# Patient Record
Sex: Female | Born: 1965 | Race: Black or African American | Hispanic: No | Marital: Married | Smoking: Never smoker
Health system: Southern US, Community
[De-identification: ages and names within clinical notes are randomized; demographics above are authoritative.]

---

## 2018-10-17 ENCOUNTER — Emergency Department (HOSPITAL_COMMUNITY): Payer: Self-pay

## 2018-10-17 ENCOUNTER — Encounter (HOSPITAL_COMMUNITY): Payer: Self-pay

## 2018-10-17 ENCOUNTER — Emergency Department (HOSPITAL_COMMUNITY)
Admission: EM | Admit: 2018-10-17 | Discharge: 2018-10-17 | Disposition: A | Discharge status: Home / Self Care | Payer: Self-pay | Attending: Emergency Medicine | Admitting: Emergency Medicine

## 2018-10-17 ENCOUNTER — Other Ambulatory Visit: Payer: Self-pay

## 2018-10-17 DIAGNOSIS — R1013 Epigastric pain: Secondary | ICD-10-CM | POA: Insufficient documentation

## 2018-10-17 DIAGNOSIS — R1084 Generalized abdominal pain: Secondary | ICD-10-CM

## 2018-10-17 DIAGNOSIS — Z7982 Long term (current) use of aspirin: Secondary | ICD-10-CM | POA: Insufficient documentation

## 2018-10-17 LAB — COMPREHENSIVE METABOLIC PANEL
ALT: 30 U/L (ref 0–44)
AST: 44 U/L — AB (ref 15–41)
Albumin: 3.7 g/dL (ref 3.5–5.0)
Alkaline Phosphatase: 118 U/L (ref 38–126)
Anion gap: 11 (ref 5–15)
BUN: 9 mg/dL (ref 6–20)
CO2: 27 mmol/L (ref 22–32)
Calcium: 8.7 mg/dL — ABNORMAL LOW (ref 8.9–10.3)
Chloride: 100 mmol/L (ref 98–111)
Creatinine, Ser: 0.77 mg/dL (ref 0.44–1.00)
GFR calc Af Amer: >60 mL/min (ref >60)
Glucose, Bld: 254 mg/dL — ABNORMAL HIGH (ref 70–99)
Potassium: 3.3 mmol/L — ABNORMAL LOW (ref 3.5–5.1)
Sodium: 138 mmol/L (ref 135–145)
Total Bilirubin: 1.2 mg/dL (ref 0.3–1.2)
Total Protein: 6.7 g/dL (ref 6.5–8.1)

## 2018-10-17 LAB — CBC WITH DIFFERENTIAL/PLATELET
Abs Immature Granulocytes: 0.09 10*3/uL — ABNORMAL HIGH (ref 0.00–0.07)
BASOS PCT: 0 %
Basophils Absolute: 0 10*3/uL (ref 0.0–0.1)
Eosinophils Absolute: 0.1 10*3/uL (ref 0.0–0.5)
Eosinophils Relative: 1 %
HCT: 40.2 % (ref 36.0–46.0)
Hemoglobin: 13.4 g/dL (ref 12.0–15.0)
Immature Granulocytes: 1 %
Lymphocytes Relative: 3 %
Lymphs Abs: 0.4 10*3/uL — ABNORMAL LOW (ref 0.7–4.0)
MCH: 26.5 pg (ref 26.0–34.0)
MCHC: 33.3 g/dL (ref 30.0–36.0)
MCV: 79.6 fL — ABNORMAL LOW (ref 80.0–100.0)
Monocytes Absolute: 0.9 10*3/uL (ref 0.1–1.0)
Monocytes Relative: 5 %
Neutro Abs: 15.7 10*3/uL — ABNORMAL HIGH (ref 1.7–7.7)
Neutrophils Relative %: 90 %
Platelets: 206 10*3/uL (ref 150–400)
RBC: 5.05 MIL/uL (ref 3.87–5.11)
RDW: 13.7 % (ref 11.5–15.5)
WBC: 17.2 10*3/uL — ABNORMAL HIGH (ref 4.0–10.5)
nRBC: 0 % (ref 0.0–0.2)

## 2018-10-17 LAB — URINALYSIS, ROUTINE W REFLEX MICROSCOPIC
Bacteria, UA: NONE SEEN
Bilirubin Urine: NEGATIVE
Glucose, UA: >=500 mg/dL — AB
Ketones, ur: NEGATIVE mg/dL
Nitrite: NEGATIVE
PROTEIN: NEGATIVE mg/dL
Specific Gravity, Urine: 1.039 — ABNORMAL HIGH (ref 1.005–1.030)
pH: 5 (ref 5.0–8.0)

## 2018-10-17 LAB — LIPASE, BLOOD: Lipase: 31 U/L (ref 11–51)

## 2018-10-17 LAB — I-STAT BETA HCG BLOOD, ED (MC, WL, AP ONLY): I-stat hCG, quantitative: <5 m[IU]/mL (ref <5)

## 2018-10-17 MED ORDER — FAMOTIDINE IN NACL 20-0.9 MG/50ML-% IV SOLN
20.0000 mg | Freq: Once | INTRAVENOUS | Status: AC
  Administered 2018-10-17: 20 mg via INTRAVENOUS
  Filled 2018-10-17: qty 50

## 2018-10-17 MED ORDER — MORPHINE SULFATE (PF) 2 MG/ML IV SOLN
2.0000 mg | Freq: Once | INTRAVENOUS | Status: AC
  Administered 2018-10-17: 2 mg via INTRAVENOUS
  Filled 2018-10-17: qty 1

## 2018-10-17 MED ORDER — ONDANSETRON HCL 4 MG/2ML IJ SOLN
4.0000 mg | Freq: Once | INTRAMUSCULAR | Status: AC
  Administered 2018-10-17: 4 mg via INTRAVENOUS
  Filled 2018-10-17: qty 2

## 2018-10-17 MED ORDER — IOPAMIDOL (ISOVUE-300) INJECTION 61%
INTRAVENOUS | Status: AC
  Filled 2018-10-17: qty 100

## 2018-10-17 MED ORDER — KETOROLAC TROMETHAMINE 15 MG/ML IJ SOLN
15.0000 mg | Freq: Once | INTRAMUSCULAR | Status: AC
  Administered 2018-10-17: 15 mg via INTRAVENOUS
  Filled 2018-10-17: qty 1

## 2018-10-17 MED ORDER — SODIUM CHLORIDE 0.9 % IV BOLUS
500.0000 mL | Freq: Once | INTRAVENOUS | Status: AC
  Administered 2018-10-17: 500 mL via INTRAVENOUS

## 2018-10-17 MED ORDER — SODIUM CHLORIDE (PF) 0.9 % IJ SOLN
INTRAMUSCULAR | Status: AC
  Filled 2018-10-17: qty 50

## 2018-10-17 MED ORDER — IOPAMIDOL (ISOVUE-300) INJECTION 61%
100.0000 mL | Freq: Once | INTRAVENOUS | Status: AC | PRN
  Administered 2018-10-17: 100 mL via INTRAVENOUS

## 2018-10-17 MED ORDER — ONDANSETRON HCL 4 MG PO TABS: 4.0000 mg | ORAL_TABLET | Freq: Four times a day (QID) | ORAL | 0 refills | Status: AC

## 2018-10-17 MED ORDER — FAMOTIDINE 20 MG PO TABS: 20.0000 mg | ORAL_TABLET | Freq: Two times a day (BID) | ORAL | 0 refills | Status: AC

## 2018-10-17 NOTE — ED Notes (Signed)
Bed: [facility] Expected date:  Expected time:  Means of arrival:  Comments: 

## 2018-10-17 NOTE — ED Triage Notes (Signed)
EMS-patient was on a 14 day fast-ended fast yesterday-states drank a lot of caffeine and ate barbeque today-started feeling nauseated-vomited twice prior to EMS arriving

## 2018-10-17 NOTE — ED Triage Notes (Signed)
Pt c/o generalized abdominal pain since this morning. Pt stated started water only fast for 12 days on Jan 7. Ate [employer] bisquit sausage, 2 hot dogs and slice of pizza and hot chicken wings since ending fast.

## 2018-10-17 NOTE — Discharge Instructions (Signed)
Please return for any problem.  Please follow-up with your regular care provider as instructed.  Use Zofran for nausea and Pepcid for GI upset as prescribed.  Adjust your diet as prescribed to help control your symptoms.

## 2018-10-17 NOTE — ED Provider Notes (Signed)
Patient seen after signout from prior provider.  CT imaging has been obtained.  There is no evidence of significant acute pathology on CT imaging.  Reevaluated at approximately 630.  She reports feeling improved following IV fluids and Pepcid and Zofran.  She desires discharge.  She is aware of the need for close follow-up.  I suspect that her recent dieting and subsequent food choices may be part of the reason for her symptoms.  She is advised to closely follow-up with her regular care providers.  Strict return precautions given and understood -precautions included worsening or return of abdominal pain, fever, vomiting, or other acute complaint.   Wynetta Fines, MD 10/17/18 [phone]

## 2018-10-17 NOTE — ED Provider Notes (Signed)
[facility]-EMERGENCY DEPT Provider Note   CSN: [id number] Arrival date & time: 10/17/18  1218     History   Chief Complaint Chief Complaint  Patient presents with  . Abdominal Pain    HPI Laura Hicks is a 53 y.o. female.  HPI  53 yo female complianing of epigstric pain began today around 0930, chills, nausea and vomited x 2 , no brb or bilous .  She denies any similar symptoms in the past.  This was sudden in onset and is severe in nature.  She denies any history of peptic ulcer disease, nonsteroidal use, but has had her gallbladder out in the past.  She also states that she did a 14-day water only fast up until several days ago.  States she has done this multiple times in the past.  She states that she does this for spiritual reasons and not medical or physical reasons.  PMH DM takes insulin  History reviewed. No pertinent past medical history.  There are no active problems to display for this patient.  PMH Dm htn Sleep apnea S/p cholecystectome S/p tonsillectomy Partial thyoidectomy Breast reduction No pmd  History reviewed. No pertinent surgical history.   OB History   No obstetric history on file.      Home Medications    Prior to Admission medications   Medication Sig Start Date End Date Taking? Authorizing Provider  aspirin EC 81 MG tablet Take 162 mg by mouth daily.   Yes [provider]    Family History History reviewed. No pertinent family history.  Social History Social History   Tobacco Use  . Smoking status: Never Smoker  . Smokeless tobacco: Never Used  Substance Use Topics  . Alcohol use: Not on file  . Drug use: Not on file     Allergies   Patient has no known allergies.   Review of Systems Review of Systems  Constitutional: Positive for chills. Negative for activity change, appetite change, fatigue, fever and unexpected weight change.  HENT: Negative.   Eyes: Negative.   Respiratory:  Negative.  Negative for shortness of breath.   Cardiovascular: Negative.   Gastrointestinal: Positive for abdominal pain, nausea and vomiting. Negative for constipation and diarrhea.  Endocrine: Negative.   Genitourinary: Negative.   Musculoskeletal: Negative.   Skin: Negative.   All other systems reviewed and are negative.    Physical Exam Updated Vital Signs BP (!) 148/85   Pulse (!) 104   Temp 98.1 F (36.7 C) (Oral)   Resp 18   Ht 1.676 m (5\' 6" )   Wt 124.7 kg   SpO2 98%   BMI 44.39 kg/m   Physical Exam Vitals signs and nursing note reviewed.  Constitutional:      Appearance: She is well-developed. She is obese.  HENT:     Head: Normocephalic and atraumatic.     Mouth/Throat:     Mouth: Mucous membranes are moist.  Eyes:     Extraocular Movements: Extraocular movements intact.  Cardiovascular:     Rate and Rhythm: Normal rate and regular rhythm.  Pulmonary:     Effort: Pulmonary effort is normal.     Breath sounds: Normal breath sounds.  Abdominal:     General: Bowel sounds are normal. There is distension.     Palpations: Abdomen is soft.     Tenderness: There is generalized abdominal tenderness.  Skin:    General: Skin is warm and dry.     Capillary Refill: Capillary  refill takes less than 2 seconds.  Neurological:     General: No focal deficit present.     Mental Status: She is alert and oriented to person, place, and time.  Psychiatric:        Mood and Affect: Mood normal.      ED Treatments / Results  Labs (all labs ordered are listed, but only abnormal results are displayed) Labs Reviewed - No data to display  EKG None  Radiology No results found.  Procedures Procedures (including critical care time)  Medications Ordered in ED Medications - No data to display   Initial Impression / Assessment and Plan / ED Course  I have reviewed the triage vital signs and the nursing notes.  Pertinent labs & imaging results that were available  during my care of the patient were reviewed by me and considered in my medical decision making (see chart for details).   53 year old female with past medical history significant for type 2 diabetes presents today complaining of some fairly sudden onset of abdominal pain.  Her abdomen is diffusely tender.  White blood cell count is 17,000.  Differential diagnosis most concerning for perforated viscus specifically ulcer disease, small bowel obstruction, pancreatitis, or other intra-abdominal abnormality.  Patient has CT scan ordered and is pending Discussed with Dr. Rodena MedinMessick and he will follow-up CT scan and remaining labs Final Clinical Impressions(s) / ED Diagnoses   Final diagnoses:  Generalized abdominal pain    ED Discharge Orders    None       Margarita Grizzleay, Kaylina Cahue, MD 10/17/18 1528

## 2020-01-09 IMAGING — CT CT ABD-PELV W/ CM
2 of 5 series · 17 of 46 positions shown, 19 images · IV contrast (ISOVUE)
Comparison: None.

CLINICAL DATA: Patient was on a 14 day fast ending yesterday and
states drank a lot of caffeine and ate barbecue today which
initiated nausea and vomiting.

EXAM:
CT ABDOMEN AND PELVIS WITH CONTRAST
TECHNIQUE: Multidetector CT imaging of the abdomen and pelvis was performed
using the standard protocol following bolus administration of
intravenous contrast.
CONTRAST:  100mL 3TDCTP-Y44 IOPAMIDOL (3TDCTP-Y44) INJECTION 61%

[Series 2: axial st · axial · 0.98mm/px · z∈[-730,-305]mm · 14 of 99 slices shown, 16 images]
[im 7/99  soft-tissue]
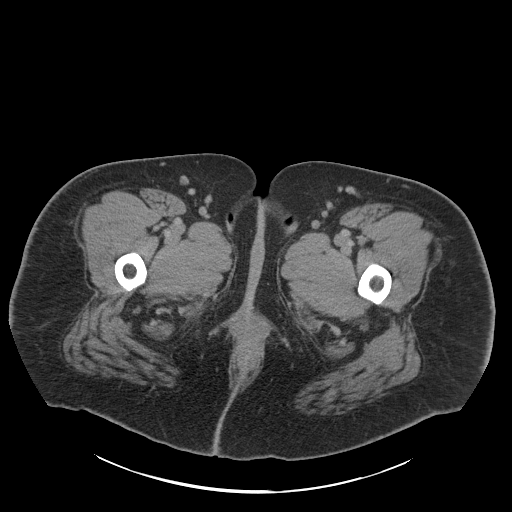
[im 7/99  bone]
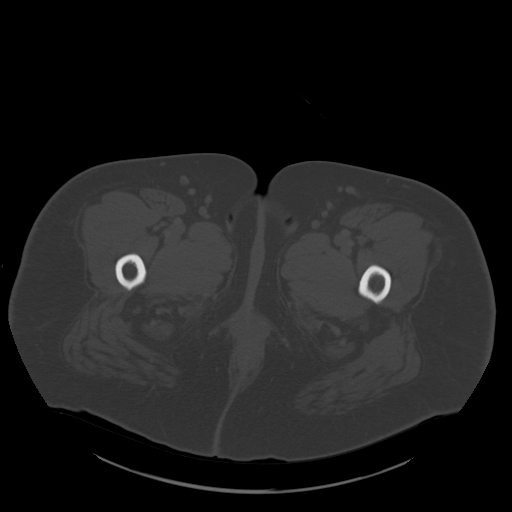
[im 14/99  soft-tissue]
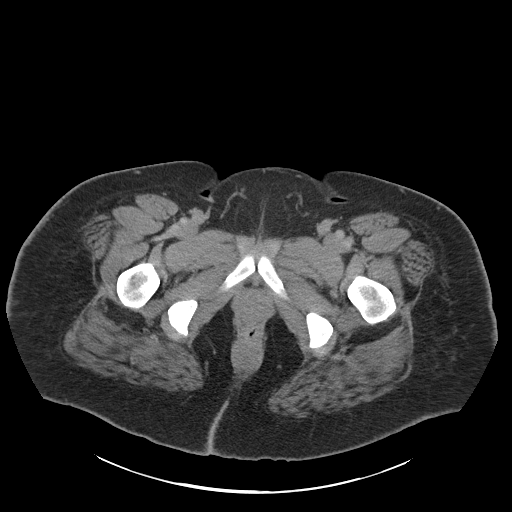
[im 20/99  soft-tissue]
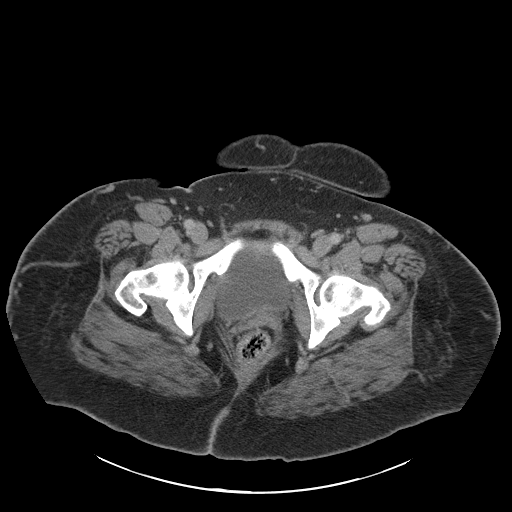
[im 27/99  soft-tissue]
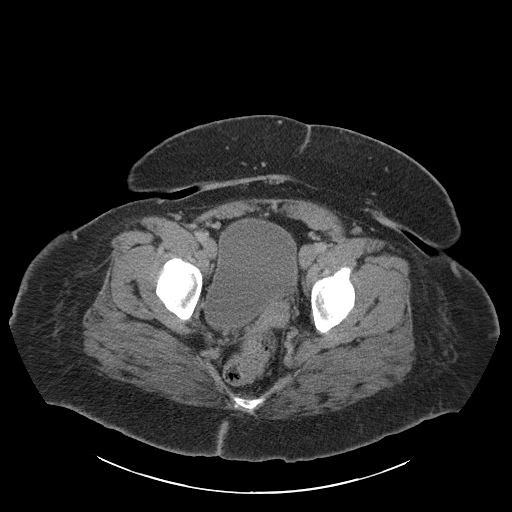
[im 33/99  soft-tissue]
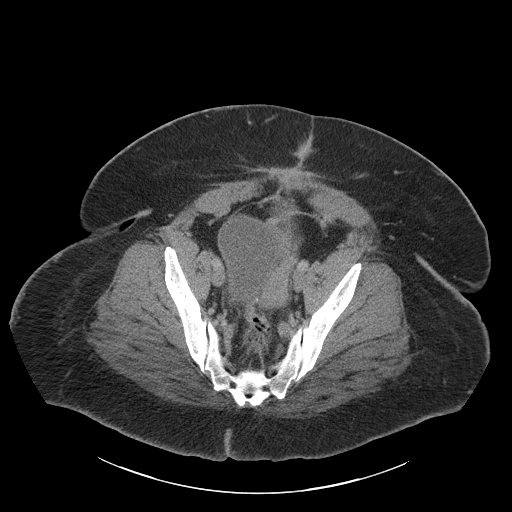
[im 40/99  soft-tissue]
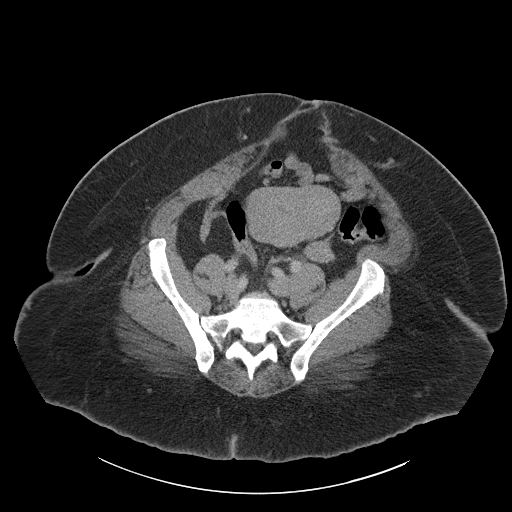
[im 46/99  soft-tissue]
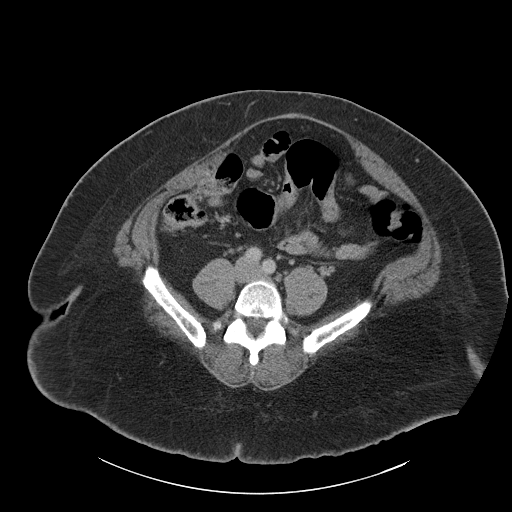
[im 53/99  soft-tissue]
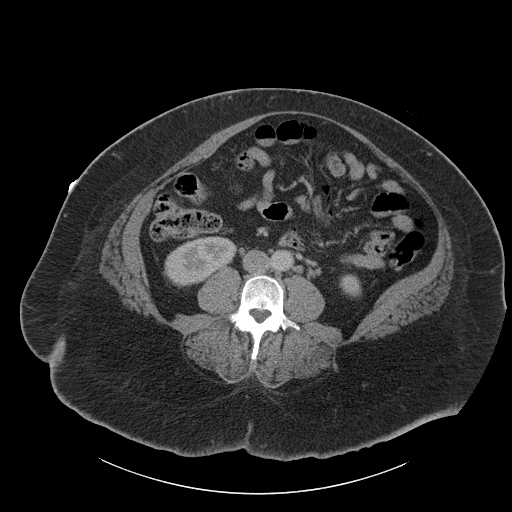
[im 59/99  soft-tissue]
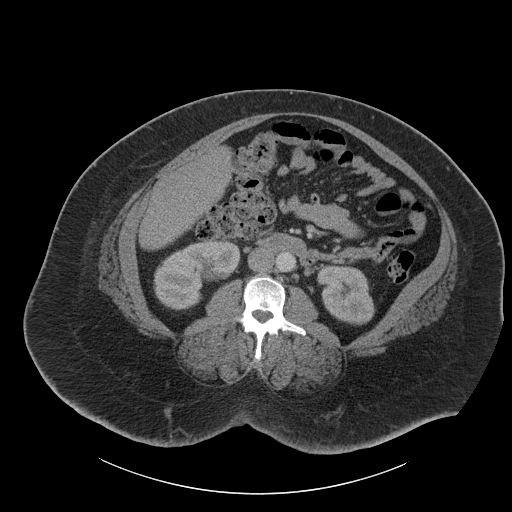
[im 59/99  bone]
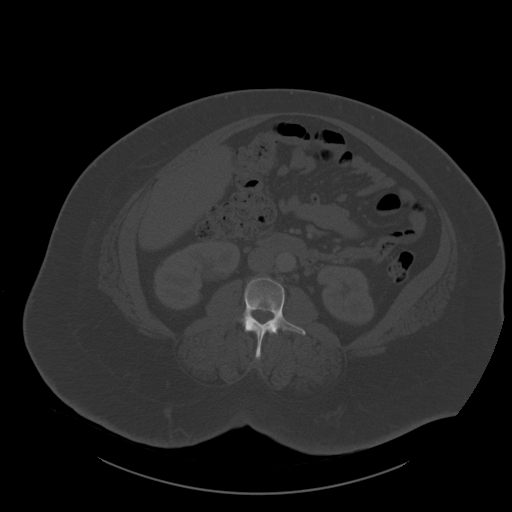
[im 66/99  soft-tissue]
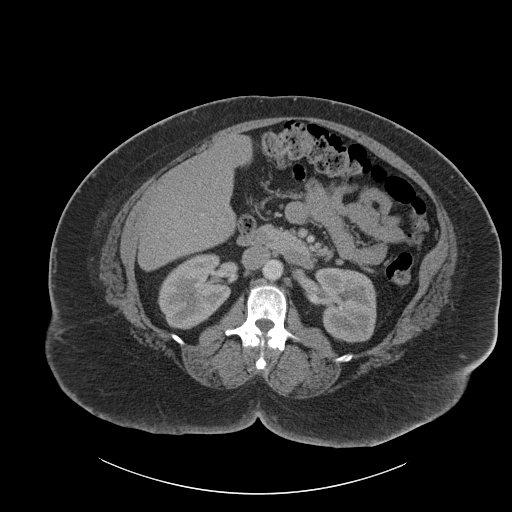
[im 72/99  soft-tissue]
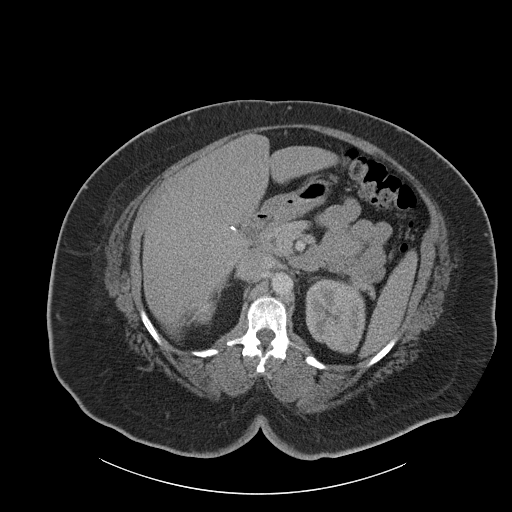
[im 79/99  soft-tissue]
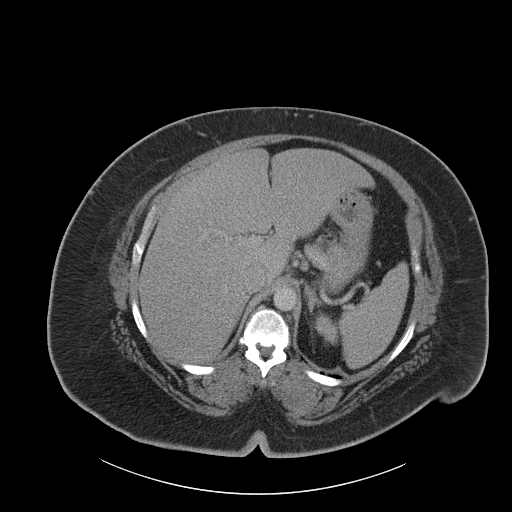
[im 85/99  soft-tissue]
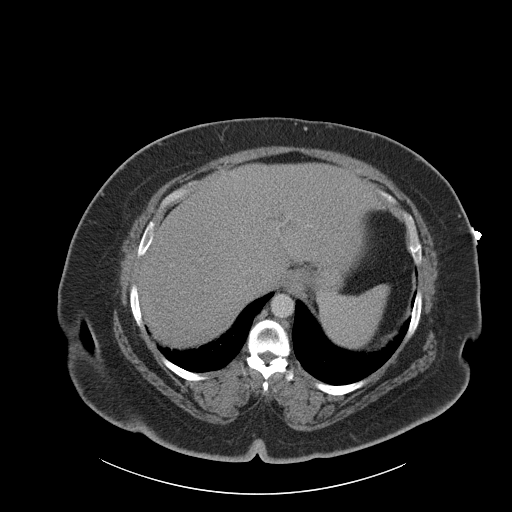
[im 92/99  soft-tissue]
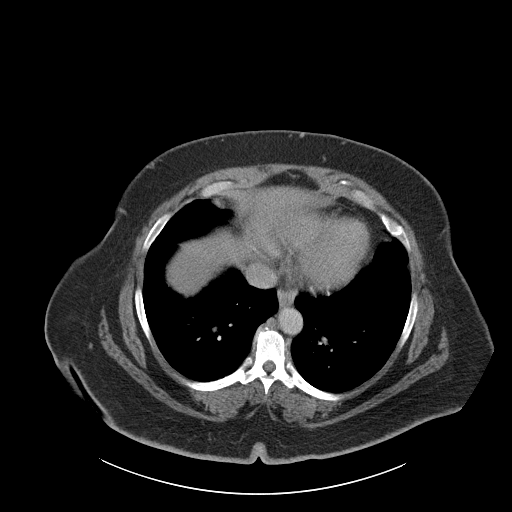

[Series 5: coronal st · coronal · 0.94mm/px · 3 of 129 slices shown]
[im 43/129  soft-tissue]
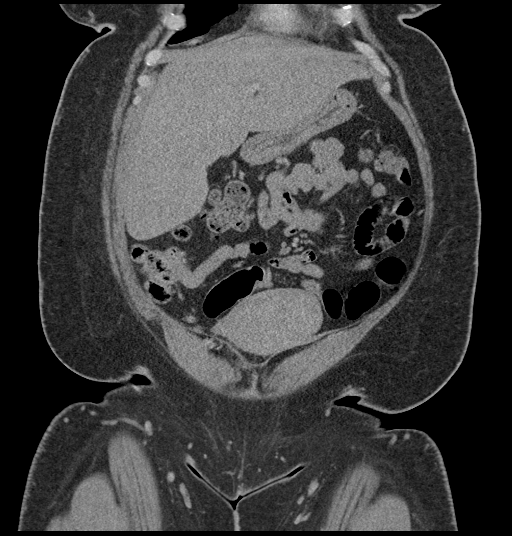
[im 57/129  soft-tissue]
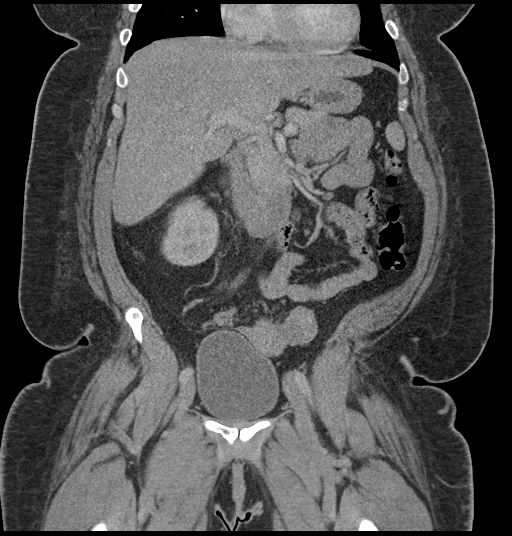
[im 72/129  soft-tissue]
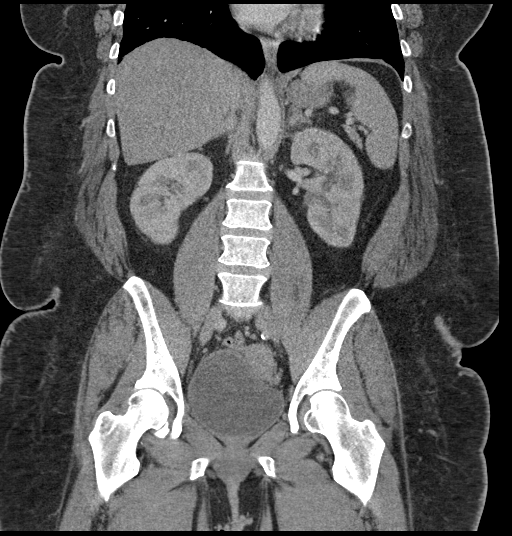

[17 of 46 positions shown; findings below may reference images not displayed]

FINDINGS: Lower chest: The included heart size is normal without pericardial
effusion or thickening. Lung bases are clear without acute
abnormality.

Hepatobiliary: Steatosis of the liver. No hepatic mass. The patient
is status post cholecystectomy. There is no biliary dilatation.

Pancreas: Normal

Spleen: Normal

Adrenals/Urinary Tract: Normal

Stomach/Bowel: Decompressed stomach. Normal small bowel rotation. No
bowel obstruction or inflammation. Moderate stool retention within
the colon. The appendix is not confidently identified but no
pericecal inflammation is noted.

Vascular/Lymphatic: No significant vascular findings are present. No
enlarged abdominal or pelvic lymph nodes.

Reproductive: Bulky appearance of the uterus with probable
left-sided fundal fibroid measuring up to 4.9 cm. No adnexal mass.

Other: Infraumbilical midline ventral scarring

Musculoskeletal: T7 through T12 degenerative disc disease. Mild disc
bulges at L4-5 and L5-S1.
IMPRESSION: 1. Hepatic steatosis.
2. Status post cholecystectomy.
3. Bulky appearance of the uterus with probable left-sided fundal
fibroid measuring 4.9 cm.
4. Mild disc bulges L4-5 and L5-S1.
5. No acute bowel obstruction or inflammation.
